# Patient Record
Sex: Female | Born: 2005 | Race: White | Hispanic: No | Marital: Single | Smoking: Never smoker
Health system: Southern US, Community
[De-identification: ages and names within clinical notes are randomized; demographics above are authoritative.]

## PROBLEM LIST (undated history)

## (undated) DIAGNOSIS — F419 Anxiety disorder, unspecified: Secondary | ICD-10-CM

## (undated) HISTORY — DX: Anxiety disorder, unspecified: F41.9

---
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
## 2023-01-03 ENCOUNTER — Ambulatory Visit (HOSPITAL_COMMUNITY): Attending: Cardiology

## 2023-01-03 DIAGNOSIS — R0602 Shortness of breath: Secondary | ICD-10-CM | POA: Diagnosis not present

## 2023-01-03 DIAGNOSIS — R000 Tachycardia, unspecified: Secondary | ICD-10-CM | POA: Diagnosis not present

## 2023-01-05 LAB — EXERCISE TOLERANCE TEST
Angina Index: 0
Estimated workload: 10.5
Exercise duration (min): 9 min
Exercise duration (sec): 15 s
MPHR: 201 {beats}/min
Peak HR: 181 {beats}/min
Percent HR: 90 %
Rest HR: 89 {beats}/min

## 2023-01-17 NOTE — Progress Notes (Unsigned)
  Cardiology Office Note:   Date:  01/19/2023  ID:  Tonya Blevins, DOB 20-Apr-2006, MRN [id number] PCP: Bjorn Pippin, MD  [facility] Providers Cardiologist:  Rollene Rotunda, MD {  History of Present Illness:   Tonya Blevins is a 17 y.o. female  with a hx of palpitations, anxiety.  She was treated for a while with propranolol but these palpitations continued and so she was started on atenolol.  She was having some shortness of breath and had a POET (Plain Old Exercise Treadmill) which was unremarkable.  She also had a history of mitral valve prolapse.  Echocardiogram was done.  This demonstrated normal echocardiogram with no mitral valve prolapse or regurgitation.  She returns for follow-up.  She is felt much better with the atenolol.  She is having much less in the way of tachypalpitations.  She has not had any presyncope or syncope and she thinks her breathing is normal.  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  She is going back to school in the fall.  She is also going through IVF starting in September.  She wants to switch from atenolol to labetalol.    Studies Reviewed:    EKG:   NA  Risk Assessment/Calculations:            Physical Exam:   VS:  BP 128/80 (BP Location: Left Arm, Patient Position: Sitting, Cuff Size: Normal)   Pulse 93   Ht 5\' 6"  (1.676 m)   Wt 148 lb (67.1 kg)   SpO2 98%   BMI 23.89 kg/m    Wt Readings from Last 3 Encounters:  01/19/23 148 lb (67.1 kg) (80%, Z= 0.83)*  01/03/23 149 lb (67.6 kg) (81%, Z= 0.87)*  12/09/22 149 lb 9.6 oz (67.9 kg) (81%, Z= 0.89)*   * Growth percentiles are based on CDC (Girls, 2-20 Years) data.     GEN: Well nourished, well developed in no acute distress NECK: No JVD; No carotid bruits CARDIAC: RRR, no murmurs, rubs, gallops RESPIRATORY:  Clear to auscultation without rales, wheezing or rhonchi  ABDOMEN: Soft, non-tender, non-distended EXTREMITIES:  No edema; No deformity    ASSESSMENT AND PLAN:   Tachycardia: She is doing better with the atenolol.  However, now she is going to go through IVF and wants to switch to labetalol so I will do this at 100 mg twice daily.  SOB: This is improved.  Echo and treadmill were unremarkable.  No further cardiac workup.       Follow up me in six months.   Signed, Rollene Rotunda, MD

## 2023-01-19 ENCOUNTER — Ambulatory Visit: Attending: Cardiology | Admitting: Cardiology

## 2023-01-19 ENCOUNTER — Encounter: Payer: Self-pay | Admitting: Cardiology

## 2023-01-19 ENCOUNTER — Ambulatory Visit: Admitting: Cardiology

## 2023-01-19 VITALS — BP 128/80 | HR 93 | Ht 66.0 in | Wt 148.0 lb

## 2023-01-19 DIAGNOSIS — R000 Tachycardia, unspecified: Secondary | ICD-10-CM

## 2023-01-19 DIAGNOSIS — R0602 Shortness of breath: Secondary | ICD-10-CM | POA: Diagnosis not present

## 2023-01-19 MED ORDER — LABETALOL HCL 100 MG PO TABS: 100.0000 mg | ORAL_TABLET | Freq: Two times a day (BID) | ORAL | 3 refills | Status: DC

## 2023-01-19 NOTE — Patient Instructions (Signed)
Medication Instructions:  Stop Atenolol Start Labetalol 100 mg twice a day Continue all other medications *If you need a refill on your cardiac medications before your next appointment, please call your pharmacy*   Lab Work: None ordered   Testing/Procedures: None ordered   Follow-Up: At [facility], you and your health needs are our priority.  As part of our continuing mission to provide you with exceptional heart care, we have created designated Provider Care Teams.  These Care Teams include your primary Cardiologist (physician) and Advanced Practice Providers (APPs -  Physician Assistants and Nurse Practitioners) who all work together to provide you with the care you need, when you need it.  We recommend signing up for the patient portal called "MyChart".  Sign up information is provided on this After Visit Summary.  MyChart is used to connect with patients for Virtual Visits (Telemedicine).  Patients are able to view lab/test results, encounter notes, upcoming appointments, etc.  Non-urgent messages can be sent to your provider as well.   To learn more about what you can do with MyChart, go to [email or web address].    Your next appointment:  6 months    Call in Sept to schedule Feb appointment     Provider:  Dr.Hochrein

## 2023-05-06 ENCOUNTER — Encounter: Payer: Self-pay | Admitting: Cardiology

## 2023-07-13 ENCOUNTER — Other Ambulatory Visit: Payer: Self-pay | Admitting: Obstetrics and Gynecology

## 2023-07-13 DIAGNOSIS — N946 Dysmenorrhea, unspecified: Secondary | ICD-10-CM

## 2023-08-04 ENCOUNTER — Other Ambulatory Visit

## 2023-08-17 ENCOUNTER — Other Ambulatory Visit

## 2023-08-22 ENCOUNTER — Ambulatory Visit
Admission: RE | Admit: 2023-08-22 | Discharge: 2023-08-22 | Disposition: A | Discharge status: Home / Self Care | Source: Ambulatory Visit | Attending: Obstetrics and Gynecology | Admitting: Obstetrics and Gynecology

## 2023-08-22 ENCOUNTER — Other Ambulatory Visit

## 2023-08-22 DIAGNOSIS — N946 Dysmenorrhea, unspecified: Secondary | ICD-10-CM

## 2023-08-22 MED ORDER — GADOPICLENOL 0.5 MMOL/ML IV SOLN
7.0000 mL | Freq: Once | INTRAVENOUS | Status: AC | PRN
  Administered 2023-08-22: 7 mL via INTRAVENOUS

## 2023-08-24 NOTE — Progress Notes (Unsigned)
  Cardiology Office Note:   Date:  08/25/2023  ID:  Tonya Blevins, DOB May 01, 2006, MRN [id number] PCP: Bjorn Pippin, MD  [facility] Providers Cardiologist:  Rollene Rotunda, MD {  History of Present Illness:   Tonya Blevins is a 18 y.o. female with a hx of palpitations, anxiety.  She was treated for a while with propranolol but these palpitations continued and so she was started on atenolol.  She was having some shortness of breath and had a POET (Plain Old Exercise Treadmill) which was unremarkable.  She also had a history of mitral valve prolapse.  Echocardiogram was done.  This demonstrated normal echocardiogram with no mitral valve prolapse or regurgitation.   She returns for follow-up.  She has had some anxiety and is working with her physicians to change her medicines and recently started bupropion.  She has a slight resting tremor.  However from a cardiovascular standpoint she has done okay.  She did call since we last saw her and we increased her dose of labetalol because she was having more tachycardia but she thinks this helped.  She is not getting any orthostatic symptoms.  She denies any presyncope or syncope.  She has had no chest pressure, neck or arm discomfort.  She has had no weight gain or edema.  She is active walking across campus.  She walks for exercise and she does not get symptoms related to this.   ROS: As stated in the HPI and negative for all other systems.  Studies Reviewed:    EKG:   EKG Interpretation Date/Time:  Thursday August 25 2023 16:18:47 EDT Ventricular Rate:  84 PR Interval:  158 QRS Duration:  84 QT Interval:  354 QTC Calculation: 418 R Axis:   82  Text Interpretation: Normal sinus rhythm with sinus arrhythmia Normal ECG When compared with ECG of 01-Dec-2022 14:31, No significant change was found Confirmed by Rollene Rotunda ([id number]) on 08/25/2023 5:28:16 PM     Risk Assessment/Calculations:              Physical Exam:    VS:  BP 118/82 (BP Location: Left Arm, Patient Position: Sitting, Cuff Size: Large)   Pulse 84   Ht 5\' 6"  (1.676 m)   Wt 143 lb (64.9 kg)   SpO2 99%   BMI 23.08 kg/m    Wt Readings from Last 3 Encounters:  08/25/23 143 lb (64.9 kg) (73%, Z= 0.61)*  01/19/23 148 lb (67.1 kg) (80%, Z= 0.83)*  01/03/23 149 lb (67.6 kg) (81%, Z= 0.87)*   * Growth percentiles are based on CDC (Girls, 2-20 Years) data.     GEN: Well nourished, well developed in no acute distress NECK: No JVD; No carotid bruits CARDIAC: RRR, no murmurs, rubs, gallops RESPIRATORY:  Clear to auscultation without rales, wheezing or rhonchi  ABDOMEN: Soft, non-tender, non-distended EXTREMITIES:  No edema; No deformity   ASSESSMENT AND PLAN:   Tachycardia: The patient seems to be much improved on the labetalol.  No change in therapy.  She will let me know if she has increasing symptoms and we could go up on the dose.   Follow up with me in one year.   Signed, Rollene Rotunda, MD

## 2023-08-25 ENCOUNTER — Encounter: Payer: Self-pay | Admitting: Cardiology

## 2023-08-25 ENCOUNTER — Ambulatory Visit: Attending: Cardiology | Admitting: Cardiology

## 2023-08-25 VITALS — BP 118/82 | HR 84 | Ht 66.0 in | Wt 143.0 lb

## 2023-08-25 DIAGNOSIS — R000 Tachycardia, unspecified: Secondary | ICD-10-CM | POA: Diagnosis not present

## 2023-08-25 NOTE — Patient Instructions (Signed)

## 2024-02-21 ENCOUNTER — Other Ambulatory Visit: Payer: Self-pay | Admitting: Cardiology

## 2024-02-22 ENCOUNTER — Telehealth: Payer: Self-pay | Admitting: Cardiology

## 2024-02-22 MED ORDER — LABETALOL HCL 100 MG PO TABS: 100.0000 mg | ORAL_TABLET | Freq: Two times a day (BID) | ORAL | 1 refills | Status: AC

## 2024-02-22 NOTE — Telephone Encounter (Signed)
 RX sent in

## 2024-02-22 NOTE — Telephone Encounter (Signed)
*  STAT* If patient is at the pharmacy, call can be transferred to refill team.   1. Which medications need to be refilled? (please list name of each medication and dose if known)   labetalol  (NORMODYNE ) 100 MG tablet    2. Which pharmacy/location (including street and city if local pharmacy) is medication to be sent to? [id number] - [place], [place] - [address]   3. Do they need a 30 day or 90 day supply? 90

## 2024-05-17 ENCOUNTER — Telehealth: Payer: Self-pay | Admitting: Cardiology

## 2024-05-17 NOTE — Telephone Encounter (Signed)
  Patient is pregnant and would like to switch from Dr Lavona to Dr Sheena

## 2024-05-18 ENCOUNTER — Ambulatory Visit: Admitting: Cardiology

## 2024-06-26 ENCOUNTER — Encounter: Payer: Self-pay | Admitting: Cardiology

## 2024-06-26 ENCOUNTER — Other Ambulatory Visit: Payer: Self-pay | Admitting: Cardiology

## 2024-06-26 ENCOUNTER — Ambulatory Visit: Attending: Cardiology | Admitting: Cardiology

## 2024-06-26 ENCOUNTER — Ambulatory Visit

## 2024-06-26 VITALS — BP 120/78 | HR 86 | Ht 66.0 in | Wt 138.0 lb

## 2024-06-26 DIAGNOSIS — I4711 Inappropriate sinus tachycardia, so stated: Secondary | ICD-10-CM | POA: Diagnosis not present

## 2024-06-26 DIAGNOSIS — R002 Palpitations: Secondary | ICD-10-CM

## 2024-06-26 DIAGNOSIS — R000 Tachycardia, unspecified: Secondary | ICD-10-CM

## 2024-06-26 NOTE — Patient Instructions (Signed)
 Medication Instructions:  Your physician recommends that you continue on your current medications as directed. Please refer to the Current Medication list given to you today.  *If you need a refill on your cardiac medications before your next appointment, please call your pharmacy*   Testing/Procedures: ZIO XT- Long Term Monitor Instructions  Your physician has requested you wear a ZIO patch monitor for 14 days.  This is a single patch monitor. [employer] supplies one patch monitor per enrollment. Additional stickers are not available. Please do not apply patch if you will be having a Nuclear Stress Test,  Echocardiogram, Cardiac CT, MRI, or Chest Xray during the period you would be wearing the  monitor. The patch cannot be worn during these tests. You cannot remove and re-apply the  ZIO XT patch monitor.  Your ZIO patch monitor will be mailed 3 day USPS to your address on file. It may take 3-5 days  to receive your monitor after you have been enrolled.  Once you have received your monitor, please review the enclosed instructions. Your monitor  has already been registered assigning a specific monitor serial # to you.  Billing and Patient Assistance Program Information  We have supplied [employer] with any of your insurance information on file for billing purposes. [employer] offers a sliding scale Patient Assistance Program for patients that do not have  insurance, or whose insurance does not completely cover the cost of the ZIO monitor.  You must apply for the Patient Assistance Program to qualify for this discounted rate.  To apply, please call [employer] at [phone], select option 4, select option 2, ask to apply for  Patient Assistance Program. Meredeth will ask your household income, and how many people  are in your household. They will quote your out-of-pocket cost based on that information.  [employer] will also be able to set up a 8-month, interest-free payment plan if needed.  Applying  the monitor   Shave hair from upper left chest.  Hold abrader disc by orange tab. Rub abrader in 40 strokes over the upper left chest as  indicated in your monitor instructions.  Clean area with 4 enclosed alcohol pads. Let dry.  Apply patch as indicated in monitor instructions. Patch will be placed under collarbone on left  side of chest with arrow pointing upward.  Rub patch adhesive wings for 2 minutes. Remove white label marked 1. Remove the white  label marked 2. Rub patch adhesive wings for 2 additional minutes.  While looking in a mirror, press and release button in center of patch. A small green light will  flash 3-4 times. This will be your only indicator that the monitor has been turned on.  Do not shower for the first 24 hours. You may shower after the first 24 hours.  Press the button if you feel a symptom. You will hear a small click. Record Date, Time and  Symptom in the Patient Logbook.  When you are ready to remove the patch, follow instructions on the last 2 pages of Patient  Logbook. Stick patch monitor onto the last page of Patient Logbook.  Place Patient Logbook in the blue and white box. Use locking tab on box and tape box closed  securely. The blue and white box has prepaid postage on it. Please place it in the mailbox as  soon as possible. Your physician should have your test results approximately 7 days after the  monitor has been mailed back to [facility].  Call [facility]  at [phone] if you have questions regarding  your ZIO XT patch monitor. Call them immediately if you see an orange light blinking on your  monitor.  If your monitor falls off in less than 4 days, contact our Monitor department at [phone].  If your monitor becomes loose or falls off after 4 days call [employer] at [phone] for  suggestions on securing your monitor   Follow-Up: At [facility], you and your health needs are our priority.  As part  of our continuing mission to provide you with exceptional heart care, our providers are all part of one team.  This team includes your primary Cardiologist (physician) and Advanced Practice Providers or APPs (Physician Assistants and Nurse Practitioners) who all work together to provide you with the care you need, when you need it.  Your next appointment:   10 week(s)  Provider:   Kardie Tobb, DO    Other Instructions:

## 2024-06-26 NOTE — Progress Notes (Signed)
 "  Cardio-Obstetrics Clinic  New Evaluation  Date:  06/26/2024   ID:  Tonya Blevins, DOB 02-Aug-2005, MRN [id number]  PCP:  Tonya Delaine PARAS, MD   [facility] Providers Cardiologist:  Tonya Huntsman, DO  Electrophysiologist:  None       Referring MD: Tonya Delaine PARAS, MD   Chief Complaint:   History of Present Illness:    Tonya Blevins is a 19 y.o. female [No obstetric history on file.] who is being seen today for the evaluation of reported tachycardia at the request of Tonya Delaine PARAS, MD.    Chart review and July 2024 she underwent exercise tolerance test which was normal without evidence of ischemia.  She also did have echocardiogram which was also normal.  In 2023 she wore a ZIO monitor with no evidence of any arrhythmias.  She is currently [redacted] weeks pregnant.  Today she is in the office with her mother Tonya Blevins.  They both tell me that her symptoms started while she was in high school.  She has had multiple years of this.  She tells me of most recent that with the pregnancy she has not experienced significant symptoms except for some dizziness.  But prior to the pregnancy she would experience abrupt onset of fast heart rate which last for minutes at a time with associated lightheadedness dizziness.  Thankfully she has never passed out.  Prior CV Studies Reviewed: The following studies were reviewed today: Echocardiogram, ZIO monitor and exercise tolerance test  Past Medical History:  Diagnosis Date   Anxiety    [employer] 02/24/2020    No past surgical history on file.    OB History   No obstetric history on file.         Current Medications: Active Medications[1]   Allergies:   Cefdinir   Social History   Socioeconomic History   Marital status: Single    Spouse name: Not on file   Number of children: Not on file   Years of education: Not on file   Highest education level: Not on file  Occupational History   Not on file  Tobacco Use    Smoking status: Never   Smokeless tobacco: Never  Substance and Sexual Activity   Alcohol use: Never   Drug use: Never   Sexual activity: Not Currently  Other Topics Concern   Not on file  Social History Narrative   Lives with mom.    Social Drivers of Health   Tobacco Use: Low Risk (06/26/2024)   Patient History    Smoking Tobacco Use: Never    Smokeless Tobacco Use: Never    Passive Exposure: Not on file  Financial Resource Strain: Not on file  Food Insecurity: Low Risk (01/12/2024)   Received from [facility]   Epic    Within the past 12 months, you worried that your food would run out before you got money to buy more: Never true    Within the past 12 months, the food you bought just didn't last and you didn't have money to get more. : Never true  Transportation Needs: No Transportation Needs (01/12/2024)   Received from [facility]    In the past 12 months, has lack of reliable transportation kept you from medical appointments, meetings, work or from getting things needed for daily living? : No  Physical Activity: Not on file  Stress: Not on file  Social Connections: Not on file  Depression (EYV7-0): Not on file  Alcohol Screen: Not on file  Housing: Low Risk (01/12/2024)   Received from [facility]   Epic    What is your living situation today?: I have a steady place to live    Think about the place you live. Do you have problems with any of the following? Choose all that apply:: None/None on this list  Utilities: Low Risk (01/12/2024)   Received from [facility]   Utilities    In the past 12 months has the electric, gas, oil, or water company threatened to shut off services in your home? : No  Health Literacy: Not on file      Family History  Problem Relation Age of Onset   Migraines Mother    Migraines Maternal Grandfather    Autism Neg Hx    ADD / ADHD Neg Hx    Anxiety disorder Neg Hx    Depression Neg Hx    Bipolar disorder Neg Hx        ROS:   Please see the history of present illness.    Dizziness All other systems reviewed and are negative.   Labs/EKG Reviewed:    EKG:   EKG was ordered today.  The ekg ordered today demonstrates accelerated junctional  Recent Labs: No results found for requested labs within last 365 days.   Recent Lipid Panel No results found for: CHOL, TRIG, HDL, CHOLHDL, LDLCALC, LDLDIRECT  Physical Exam:    VS:  BP 120/78 (BP Location: Right Arm, Patient Position: Sitting, Cuff Size: Normal)   Pulse 86   Ht 5' 6 (1.676 m)   Wt 138 lb (62.6 kg)   SpO2 99%   BMI 22.27 kg/m     Wt Readings from Last 3 Encounters:  06/26/24 138 lb (62.6 kg)  08/25/23 143 lb (64.9 kg) (73%, Z= 0.61)*  01/19/23 148 lb (67.1 kg) (80%, Z= 0.83)*   * Growth percentiles are based on CDC (Girls, 2-20 Years) data.     GEN:  Well nourished, well developed in no acute distress HEENT: Normal NECK: No JVD; No carotid bruits LYMPHATICS: No lymphadenopathy CARDIAC: RRR, no murmurs, rubs, gallops RESPIRATORY:  Clear to auscultation without rales, wheezing or rhonchi  ABDOMEN: Soft, non-tender, non-distended MUSCULOSKELETAL:  No edema; No deformity  SKIN: Warm and dry NEUROLOGIC:  Alert and oriented x 3 PSYCHIATRIC:  Normal affect    Risk Assessment/Risk Calculators:     CARPREG II Risk Prediction Index Score:  1.  The patient's risk for a primary cardiac event is 5%.   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  But is important to rule out arrhythmia and this is the right time to place a monitor on the patient to understand if there is any significant arrhythmia.  She is agreeable.  Will place this on her in the setting of her  dizziness as well.  I will see her back in 10 weeks Patient Instructions  Medication Instructions:  Your physician recommends that you continue on your current medications as directed. Please refer to the Current Medication list given to you today.  *If you need a refill on your cardiac medications before your next appointment, please call your pharmacy*   Testing/Procedures: ZIO XT- Long Term Monitor Instructions  Your physician has requested you wear a ZIO patch monitor for 14 days.  This is a single patch monitor. [employer] supplies one patch monitor per enrollment. Additional stickers are not available. Please do not apply patch if you will be having a Nuclear Stress Test,  Echocardiogram, Cardiac CT, MRI, or Chest Xray during the period you would be wearing the  monitor. The patch cannot be worn during these tests. You cannot remove and re-apply the  ZIO XT patch monitor.  Your ZIO patch monitor will be mailed 3 day USPS to your address on file. It may take 3-5 days  to receive your monitor after you have been enrolled.  Once you have received your monitor, please review the enclosed instructions. Your monitor  has already been registered assigning a specific monitor serial # to you.  Billing and Patient Assistance Program Information  We have supplied [employer] with any of your insurance information on file for billing purposes. [employer] offers a sliding scale Patient Assistance Program for patients that do not have  insurance, or whose insurance does not completely cover the cost of the ZIO monitor.  You must apply for the Patient Assistance Program to qualify for this discounted rate.  To apply, please call [employer] at [phone], select option 4, select option 2, ask to apply for  Patient Assistance Program. Meredeth will ask your household income, and how many people  are in your household. They will quote your out-of-pocket cost based on that information.  [employer] will also be  able to set up a 78-month, interest-free payment plan if needed.  Applying the monitor   Shave hair from upper left chest.  Hold abrader disc by orange tab. Rub abrader in 40 strokes over the upper left chest as  indicated in your monitor instructions.  Clean area with 4 enclosed alcohol pads. Let dry.  Apply patch as indicated in monitor instructions. Patch will be placed under collarbone on left  side of chest with arrow pointing upward.  Rub patch adhesive wings for 2 minutes. Remove white label marked 1. Remove the white  label marked 2. Rub patch adhesive wings for 2 additional minutes.  While looking in a mirror, press and release button in center of patch. A small green light will  flash 3-4 times. This will be your only indicator that the monitor has been turned on.  Do not shower for the first 24 hours. You may shower after the first 24 hours.  Press the button if you feel a symptom. You will hear a small click. Record Date, Time and  Symptom in the Patient Logbook.  When you are ready to remove the patch, follow instructions on the last 2 pages of Patient  Logbook. Stick patch monitor onto the last page of Patient Logbook.  Place Patient Logbook in the blue and white box. Use locking tab on box and tape box closed  securely. The blue and white box has prepaid postage on it. Please place it in the mailbox as  soon as possible. Your physician should have your test results approximately 7 days after the  monitor has been mailed back to [facility].  Call [facility] Customer Care at [phone] if you have questions regarding  your ZIO XT patch monitor. Call them immediately if you see an orange light blinking on your  monitor.  If your monitor falls off in less than 4 days, contact our Monitor department at [phone].  If your monitor becomes loose or falls off after  4 days call [employer] at [phone] for  suggestions on securing your monitor   Follow-Up: At  [facility], you and your health needs are our priority.  As part of our continuing mission to provide you with exceptional heart care, our providers are all part of one team.  This team includes your primary Cardiologist (physician) and Advanced Practice Providers or APPs (Physician Assistants and Nurse Practitioners) who all work together to provide you with the care you need, when you need it.  Your next appointment:   10 week(s)  Provider:   Valta Dillon, DO    Other Instructions:            Dispo:  No follow-ups on file.   Medication Adjustments/Labs and Tests Ordered: Current medicines are reviewed at length with the patient today.  Concerns regarding medicines are outlined above.  Tests Ordered: Orders Placed This Encounter  Procedures   EKG 12-Lead   Medication Changes: No orders of the defined types were placed in this encounter.     [1]  Current Meds  Medication Sig   labetalol  (NORMODYNE ) 100 MG tablet Take 1 tablet (100 mg total) by mouth 2 (two) times daily.   sertraline (ZOLOFT) 50 MG tablet    "

## 2024-06-26 NOTE — Progress Notes (Unsigned)
 ZIO serial # [id number] from office inventory applied to patient.

## 2024-07-16 ENCOUNTER — Ambulatory Visit: Payer: Self-pay | Admitting: Cardiology

## 2024-07-16 DIAGNOSIS — R002 Palpitations: Secondary | ICD-10-CM

## 2024-07-16 DIAGNOSIS — I4711 Inappropriate sinus tachycardia, so stated: Secondary | ICD-10-CM | POA: Diagnosis not present

## 2024-09-06 ENCOUNTER — Ambulatory Visit: Admitting: Cardiology

## 2024-10-18 ENCOUNTER — Inpatient Hospital Stay (HOSPITAL_COMMUNITY): Admit: 2024-10-18
